# Patient Record
Sex: Male | Born: 1965 | Race: White | Hispanic: No | Marital: Married | Smoking: Current some day smoker
Health system: Southern US, Community
[De-identification: ages and names within clinical notes are randomized; demographics above are authoritative.]

## PROBLEM LIST (undated history)

## (undated) DIAGNOSIS — F419 Anxiety disorder, unspecified: Secondary | ICD-10-CM

## (undated) DIAGNOSIS — K219 Gastro-esophageal reflux disease without esophagitis: Secondary | ICD-10-CM

## (undated) HISTORY — PX: TONSILLECTOMY: SUR1361

## (undated) HISTORY — PX: PARATHYROIDECTOMY: SHX19

---

## 2006-08-14 ENCOUNTER — Encounter: Admission: RE | Admit: 2006-08-14 | Discharge: 2006-08-14 | Payer: Self-pay | Admitting: Surgery

## 2007-02-28 ENCOUNTER — Encounter (INDEPENDENT_AMBULATORY_CARE_PROVIDER_SITE_OTHER): Payer: Self-pay | Admitting: Surgery

## 2007-02-28 ENCOUNTER — Ambulatory Visit (HOSPITAL_COMMUNITY): Admission: RE | Admit: 2007-02-28 | Discharge: 2007-03-01 | Payer: Self-pay | Admitting: Surgery

## 2007-07-28 ENCOUNTER — Ambulatory Visit: Payer: Self-pay | Admitting: Gastroenterology

## 2007-07-28 LAB — CONVERTED CEMR LAB
Ceruloplasmin: 34 mg/dL (ref 21–63)
IgA: 75 mg/dL (ref 68–378)
Iron: 142 ug/dL (ref 42–165)
Prothrombin Time: 12.2 s (ref 10.9–13.3)
Saturation Ratios: 32.5 % (ref 20.0–50.0)
Tissue Transglutaminase Ab, IgA: 0 units (ref <7)

## 2007-08-17 ENCOUNTER — Encounter: Payer: Self-pay | Admitting: Gastroenterology

## 2007-08-17 ENCOUNTER — Ambulatory Visit (HOSPITAL_COMMUNITY): Admission: RE | Admit: 2007-08-17 | Discharge: 2007-08-17 | Payer: Self-pay | Admitting: Gastroenterology

## 2010-04-26 ENCOUNTER — Encounter: Payer: Self-pay | Admitting: Surgery

## 2010-08-18 NOTE — Op Note (Signed)
NAME:  [name] [name], [name] [name]                ACCOUNT NO.:  [id number]   MEDICAL RECORD NO.:  [id number]          PATIENT TYPE:  AMB   LOCATION:  DAY                          FACILITY:  [facility]   PHYSICIAN:  Velora Heckler, MD      DATE OF BIRTH:  05-25-1965   DATE OF PROCEDURE:  02/28/2007  DATE OF DISCHARGE:                               OPERATIVE REPORT   PREOPERATIVE DIAGNOSIS:  Primary hyperparathyroidism.   POSTOPERATIVE DIAGNOSIS:  Primary hyperparathyroidism.   PROCEDURES:  1. Neck exploration.  2. Right superior parathyroidectomy.   SURGEON:  Velora Heckler, M.D., FACS   ASSISTANT:  Leonie Man, M.D.   ANESTHESIA:  General, per Quillian Quince, M.D.   ESTIMATED BLOOD LOSS:  Minimal.   PREPARATION:  Betadine.   COMPLICATIONS:  None.   INDICATIONS:  The patient is a 45 year old white male from [place],  [place].  The patient had biochemical evidence of primary  hyperparathyroidism found on routine laboratory work.  Calcium level was  10.8.  Parathyroid hormone level 114.8.  Sestamibi scan failed to  localize parathyroid adenoma.  MRI scan of the neck suggested an  inferior right parathyroid gland measuring 6 mm, but this was not  definitive.  The patient and I discussed options for management, and he  decided to proceed with neck exploration.  The patient now comes to  surgery.   BODY OF REPORT:  Procedure was done in O.R. #11 at [facility].  The patient is brought to the operating room and placed in a  supine position on the operating room table.  Following administration  of general anesthesia, the patient is prepped and draped in the usual  strict aseptic fashion.  After ascertaining that an adequate level of  anesthesia had been obtained, a Kocher incision was made with a #15  blade.  Dissection was carried through subcutaneous tissues and  platysma.  Hemostasis was obtained with the electrocautery.  Skin flaps  were elevated cephalad  and caudad from the thyroid notch to the sternal  notch.  A Horner self-retaining retractor is placed for exposure.  Strap  muscles are incised in the midline.  Dissection is begun on the lower  right based on MRI scan evidence.  Recurrent nerve is identified and  preserved.  Tissue in the tracheoesophageal groove is dissected out.  Right thyroid lobe was mobilized, with venous tributaries divided  between medium Ligaclips with the electrocautery.  Exploration reveals  no abnormal-appearing parathyroid tissue.  Thyrothymic tract is excised.  It contains only adipose tissue.  Dissection is then moved to the lower  left.  Again, the tracheoesophageal groove is thoroughly explored.  Thyrothymic tract is excised and also contains only adipose tissue.  A  few lymph nodes are identified.  Further dissection on the left involves  complete mobilization of the left thyroid lobe, with division of the  middle thyroid vein between medium hemoclips.  Dissection is carried  posteriorly to the spine.  The lateral border of the hypopharynx and  esophagus is defined.  No abnormal parathyroid tissue is identified.  Retroesophageal space is opened and explored.   Next, we turned our attention back to the right.  Right thyroid lobe is  then fully mobilized.  Middle thyroid vein is divided between medium  Ligaclips.  Right thyroid lobe is fully mobilized.  Upon mobilization of  the upper pole, an abnormal-appearing nodule of tissue is identified.  This is gently dissected out.  It has associated adipose tissue, but  appears to be an abnormally enlarged parathyroid gland.  This is gently  dissected off the posterior aspect of the capsule of the thyroid.  Vascular tributaries are divided between small Ligaclips.  The entire  gland is excised.  Care os taken to avoid injury to the recurrent nerve,  which is just below the level of this gland.  The specimen measures 2.3  x 0.5 x 0.5 cm.  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  Good hemostasis is obtained throughout the neck.  Surgicel is  placed in the operative field bilaterally.  Strap muscles are  reapproximated in the midline with interrupted 3-0 Vicryl sutures.  Platysma is closed with interrupted 3-0 Vicryl sutures.  Skin is closed  over a running 4-0 Monocryl subcuticular suture.  Wound is washed and  dried, and Benzoin and Steri-Strips are applied.  Sterile dressings are  applied.  The patient is awakened from anesthesia and brought to the  recovery room in stable condition.  The patient tolerated the procedure  well.      Velora Heckler, MD  Electronically Signed     TMG/MEDQ  D:  02/28/2007  T:  02/28/2007  Job:  [id number]   cc:   Tammy R. Collins [place], M.D.  Fax: [phone]

## 2010-08-18 NOTE — Assessment & Plan Note (Signed)
[facility]                         GASTROENTEROLOGY OFFICE NOTE   NAME:[name], [name]                         MRN:          [id number]  DATE:07/28/2007                            DOB:          06-06-1965    REASON FOR REFERRAL:  Dr. Collins [place] asked me to evaluate Mr. [name] [name] in  consultation regarding abnormal liver tests.   HISTORY OF PRESENT ILLNESS:  Mr. [name] is a very pleasant 45 year old  man who has had elevated liver tests for at least 2 years.  He is now  sure whether he started taking Crestor before or after his elevated  liver tests were noticed but he has not come off the Crestor since then.  He has an ultrasound dated July 2007, done for abnormal liver tests.  This was done at [facility] and shows normal liver, normal  gallbladder, no stones, non-dilated bile duct.  Most recent liver tests  were done in February 2009, showing an AST of 94, ALT of 132, alk phos  of 151, total bilirubin was normal.  The rest of his complete metabolic  profile was essentially normal as well.  He has never had acute hepatitis that he knows of.  He has never been  jaundiced.  He has never used IV drugs recreationally.  No distant  tattoos.  He was never exposed to other people's blood.  He does have mild intermittent attacks of pain in his epigastrium  associated with some nausea and diarrhea as well.  He said this was  happening a lot with fatty foods and dairy foods.  He modified his diet  and has not had any problems like that in the past six months.   REVIEW OF SYSTEMS:  Notable for stable weight and is otherwise  essentially normal and is available on his nursing intake sheet.   PAST MEDICAL HISTORY:  1. GERD, pyrosis dating back to his teens.  Underwent upper endoscopy      in 2004 in [place] and was told it was normal.  Currently      symptoms are well controlled on Aciphex that he takes every other      day or so.  2. History of kidney  stones.  3. History of hyperparathyroidism, status post parathyroidectomy,      November 2008, by Dr. Gerrit Friends.   CURRENT MEDICINES:  Paxil, Aciphex, Allegra, and Crestor.   ALLERGIES:  No known drug allergies.   SOCIAL HISTORY:  Married with one 19-year-old daughter.  He is the  Surveyor, minerals for [employer].  He did a lot of travel overseas,  especially 2-3 years ago.  He drinks 1-2 caffeinated drinks a day,  probably 6-12 beers in a month.   FAMILY HISTORY:  No liver disease in family.  Mother had colon cancer  and polyps.  Breast cancer also runs in his family.   PHYSICAL EXAMINATION:  VITAL SIGNS:  Height 6 feet 2 inches, 223 pounds,  blood pressure 110/72, pulse 64.  CONSTITUTIONAL:  Generally well-appearing.  NEUROLOGIC:  Alert and oriented x3.  EYES:  Extraocular movements intact.  MOUTH:  Oropharynx  moist.  No lesions.  NECK:  Supple.  No lymphadenopathy.  CARDIOVASCULAR:  Heart regular rate and rhythm.  LUNGS:  Clear to auscultation bilaterally.  ABDOMEN:  Soft, nontender, nondistended.  Normal bowel sounds.  EXTREMITIES:  No lower extremity edema.  SKIN:  No rashes or lesions on visible extremities.   ASSESSMENT:  A 45 year old man with elevated liver tests.   He did have mild epigastric pains that would happen with some fatty  foods and some dairy products.  He has modified his diet and has had no  trouble for the past 6 months.  That and the fact that his ultrasound  was completely normal in 2007, leads me to believe his liver tests are  not from a biliary cause.  He may have fatty liver disease.  Statin  medicines are known to cause hepatic enzyme elevation.  He is not sure  whether he started the Crestor before or after his liver tests were  found to be elevated.  He did not have any obvious risk factors for  hepatitis B or C.   PLAN:  We will arrange for him to have the usual set of chronic liver  disease lab testing for autoimmune liver disease, PBC, PSC,  iron  overload, Wilson's disease, hepatitis A, B, and C studies.  If none of  these labs are very revealing and his liver tests remain elevated, I  will likely repeat the ultrasound of his liver as it has been a couple  of years since the ultrasound done at [facility] in 2007.  If that is also unrevealing, then I will have him simply hold his  Crestor, repeat his liver tests in 2-3 months following that.     Rachael Fee, MD  Electronically Signed    DPJ/MedQ  DD: 07/28/2007  DT: 07/28/2007  Job #: [id number]   cc:   Tammy R. Collins [place], M.D.

## 2011-01-12 LAB — DIFFERENTIAL
Eosinophils Relative: 4
Monocytes Relative: 7

## 2011-01-12 LAB — CBC
MCHC: 35.4
WBC: 7

## 2011-01-12 LAB — URINALYSIS, ROUTINE W REFLEX MICROSCOPIC
Bilirubin Urine: NEGATIVE
Glucose, UA: NEGATIVE
Hgb urine dipstick: NEGATIVE
Ketones, ur: NEGATIVE
Nitrite: NEGATIVE
Urobilinogen, UA: 0.2
pH: 6

## 2011-01-12 LAB — BASIC METABOLIC PANEL
BUN: 13
Chloride: 106
GFR calc Af Amer: >60
GFR calc non Af Amer: >60
Glucose, Bld: 120 — ABNORMAL HIGH
Potassium: 4.3

## 2011-01-12 LAB — CALCIUM
Calcium: 9.1
Calcium: 9.9

## 2011-01-12 LAB — PROTIME-INR: INR: 0.9

## 2015-03-26 ENCOUNTER — Encounter (HOSPITAL_COMMUNITY): Payer: Self-pay | Admitting: *Deleted

## 2015-03-26 ENCOUNTER — Emergency Department (HOSPITAL_COMMUNITY): Payer: BLUE CROSS/BLUE SHIELD

## 2015-03-26 ENCOUNTER — Emergency Department (HOSPITAL_COMMUNITY)
Admission: EM | Admit: 2015-03-26 | Discharge: 2015-03-27 | Disposition: A | Discharge status: Home / Self Care | Payer: BLUE CROSS/BLUE SHIELD | Attending: Emergency Medicine | Admitting: Emergency Medicine

## 2015-03-26 DIAGNOSIS — S29001A Unspecified injury of muscle and tendon of front wall of thorax, initial encounter: Secondary | ICD-10-CM | POA: Insufficient documentation

## 2015-03-26 DIAGNOSIS — Y9389 Activity, other specified: Secondary | ICD-10-CM | POA: Insufficient documentation

## 2015-03-26 DIAGNOSIS — W01198A Fall on same level from slipping, tripping and stumbling with subsequent striking against other object, initial encounter: Secondary | ICD-10-CM | POA: Diagnosis not present

## 2015-03-26 DIAGNOSIS — R0789 Other chest pain: Secondary | ICD-10-CM

## 2015-03-26 DIAGNOSIS — Z23 Encounter for immunization: Secondary | ICD-10-CM | POA: Insufficient documentation

## 2015-03-26 DIAGNOSIS — Y998 Other external cause status: Secondary | ICD-10-CM | POA: Diagnosis not present

## 2015-03-26 DIAGNOSIS — F17200 Nicotine dependence, unspecified, uncomplicated: Secondary | ICD-10-CM | POA: Insufficient documentation

## 2015-03-26 DIAGNOSIS — Z8659 Personal history of other mental and behavioral disorders: Secondary | ICD-10-CM | POA: Insufficient documentation

## 2015-03-26 DIAGNOSIS — S0012XA Contusion of left eyelid and periocular area, initial encounter: Secondary | ICD-10-CM

## 2015-03-26 DIAGNOSIS — Y9289 Other specified places as the place of occurrence of the external cause: Secondary | ICD-10-CM | POA: Diagnosis not present

## 2015-03-26 DIAGNOSIS — S01112A Laceration without foreign body of left eyelid and periocular area, initial encounter: Secondary | ICD-10-CM | POA: Insufficient documentation

## 2015-03-26 DIAGNOSIS — Z8719 Personal history of other diseases of the digestive system: Secondary | ICD-10-CM | POA: Diagnosis not present

## 2015-03-26 DIAGNOSIS — W19XXXA Unspecified fall, initial encounter: Secondary | ICD-10-CM

## 2015-03-26 DIAGNOSIS — S0181XA Laceration without foreign body of other part of head, initial encounter: Secondary | ICD-10-CM

## 2015-03-26 DIAGNOSIS — S0993XA Unspecified injury of face, initial encounter: Secondary | ICD-10-CM | POA: Diagnosis present

## 2015-03-26 HISTORY — DX: Gastro-esophageal reflux disease without esophagitis: K21.9

## 2015-03-26 HISTORY — DX: Anxiety disorder, unspecified: F41.9

## 2015-03-26 LAB — I-STAT TROPONIN, ED: TROPONIN I, POC: 0 ng/mL (ref 0.00–0.08)

## 2015-03-26 LAB — CBC
HEMATOCRIT: 38.9 % — AB (ref 39.0–52.0)
Hemoglobin: 14 g/dL (ref 13.0–17.0)
MCH: 32.3 pg (ref 26.0–34.0)
MCHC: 36 g/dL (ref 30.0–36.0)
MCV: 89.8 fL (ref 78.0–100.0)
PLATELETS: 218 10*3/uL (ref 150–400)
RBC: 4.33 MIL/uL (ref 4.22–5.81)
RDW: 12.3 % (ref 11.5–15.5)
WBC: 8.5 10*3/uL (ref 4.0–10.5)

## 2015-03-26 IMAGING — CT CT HEAD W/O CM
3 of 6 series · 14 of 47 positions shown, 16 images · non-contrast
Comparison: None.

CLINICAL DATA: 49-year-old male with fall and head trauma.

EXAM:
CT HEAD WITHOUT CONTRAST
CT CERVICAL SPINE WITHOUT CONTRAST
TECHNIQUE: Multidetector CT imaging of the head and cervical spine was
performed following the standard protocol without intravenous
contrast. Multiplanar CT image reconstructions of the cervical spine
were also generated.

[Series 9: sagittals · sagittal · 0.26mm/px · 3 of 53 slices shown]
[im 18/53  brain]
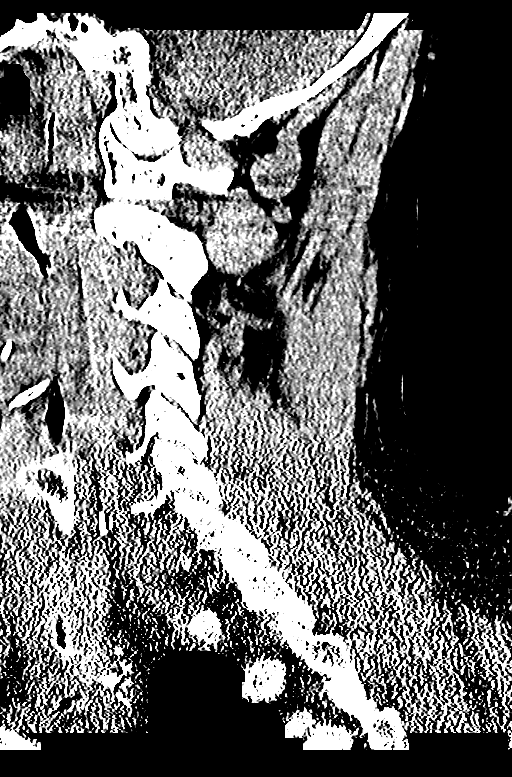
[im 27/53  brain]
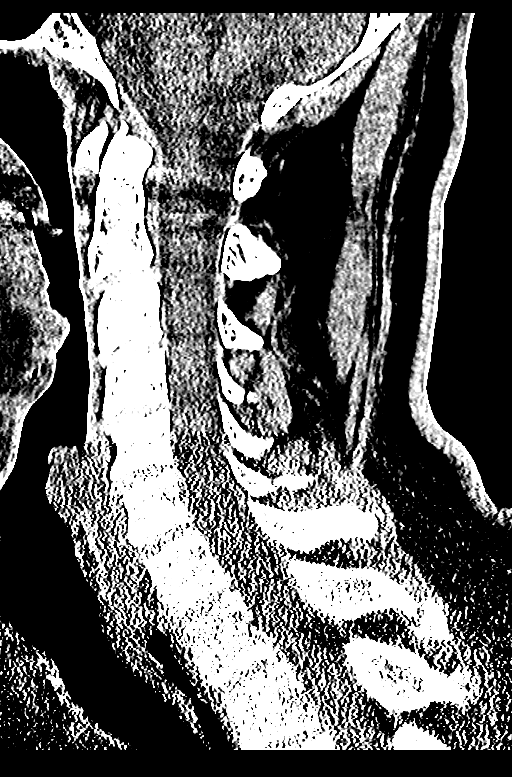
[im 35/53  brain]
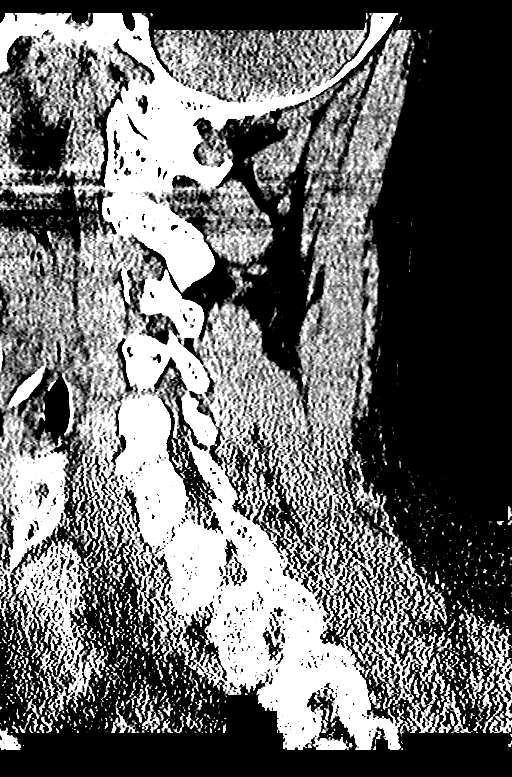

[Series 10: orthogonals · axial · 0.21mm/px · z∈[-313,-165]mm · 8 of 116 slices shown, 10 images]
[im 9/116  brain]
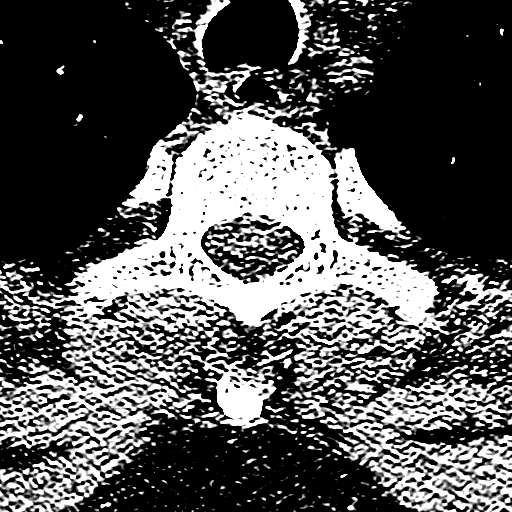
[im 9/116  bone]
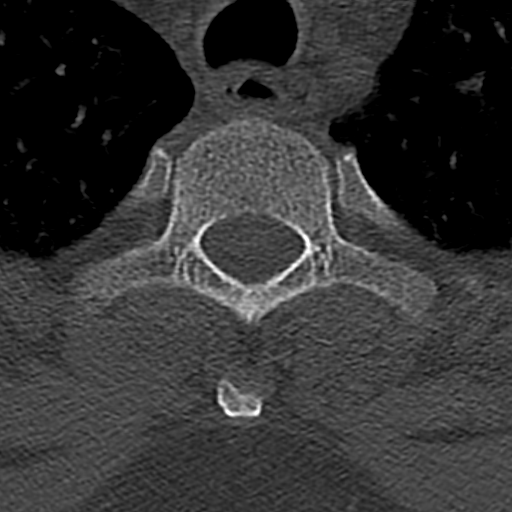
[im 27/116  brain]
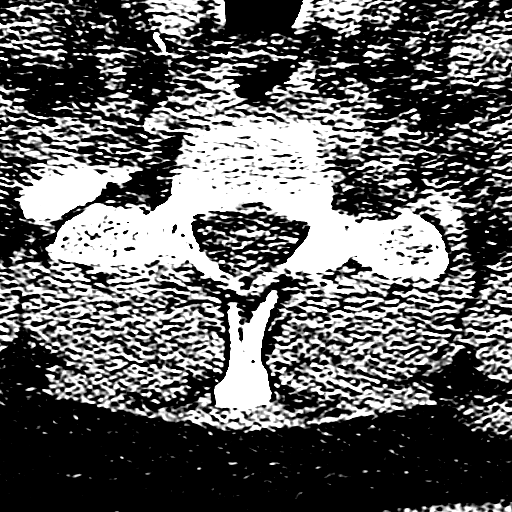
[im 36/116  brain]
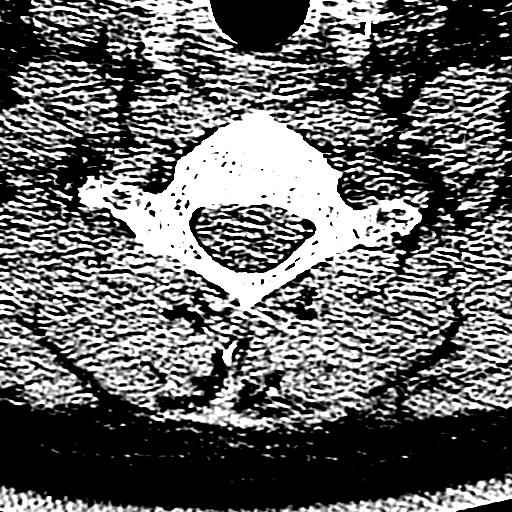
[im 54/116  brain]
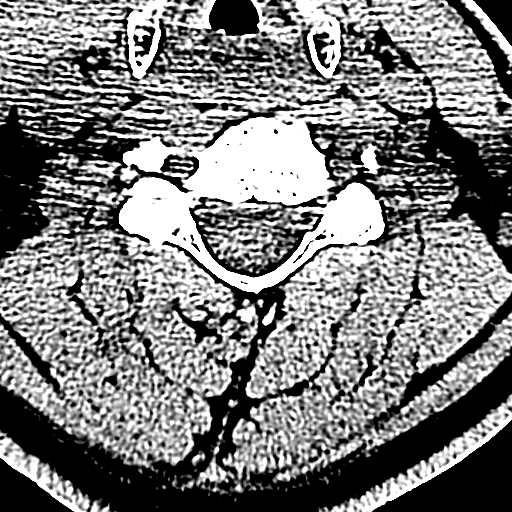
[im 62/116  brain]
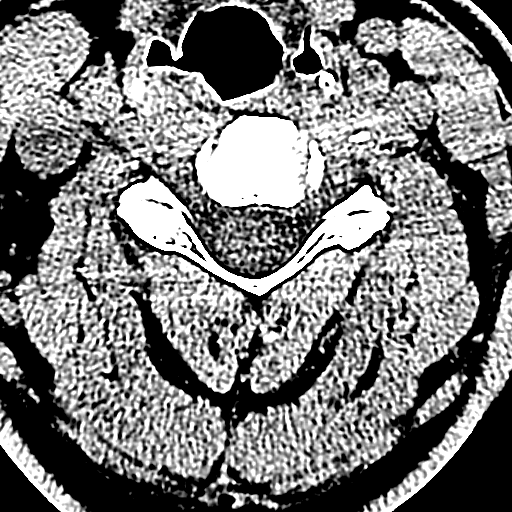
[im 62/116  bone]
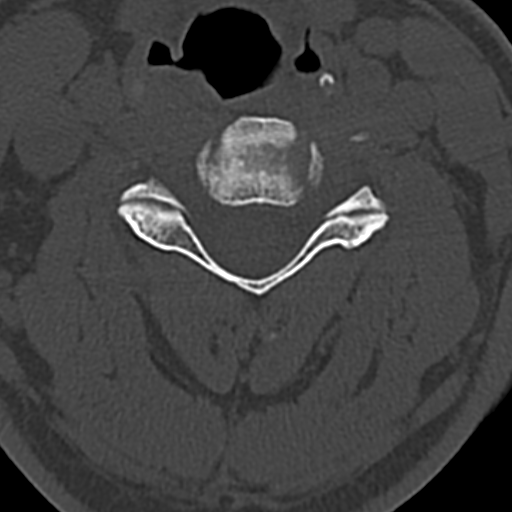
[im 80/116  brain]
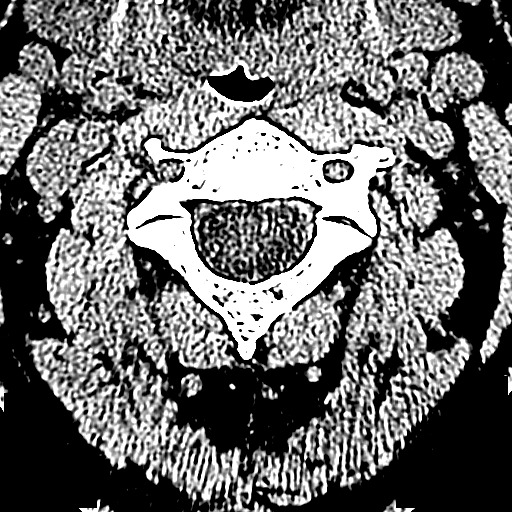
[im 89/116  brain]
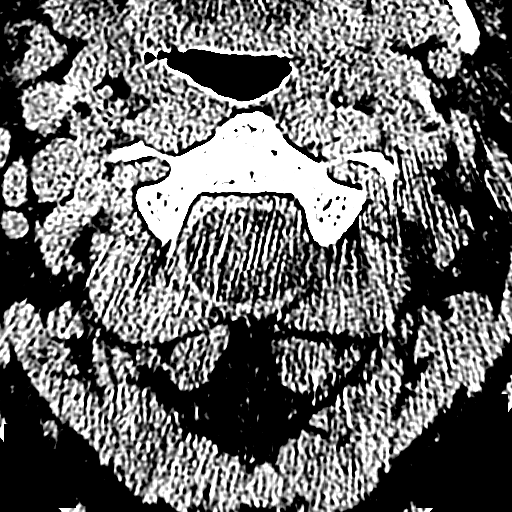
[im 107/116  brain]
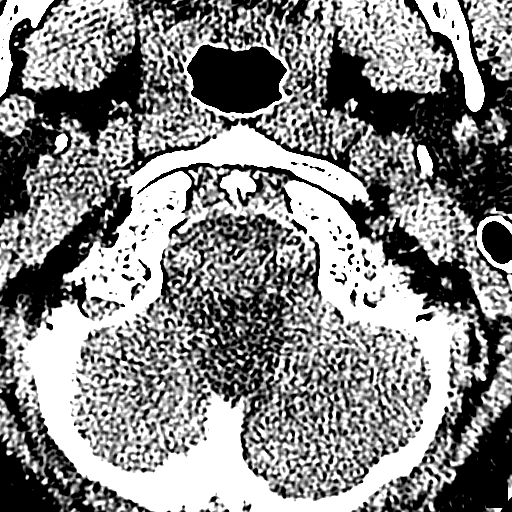

[Series 11: coronals · coronal · 0.23mm/px · 3 of 44 slices shown]
[im 18/44  brain]
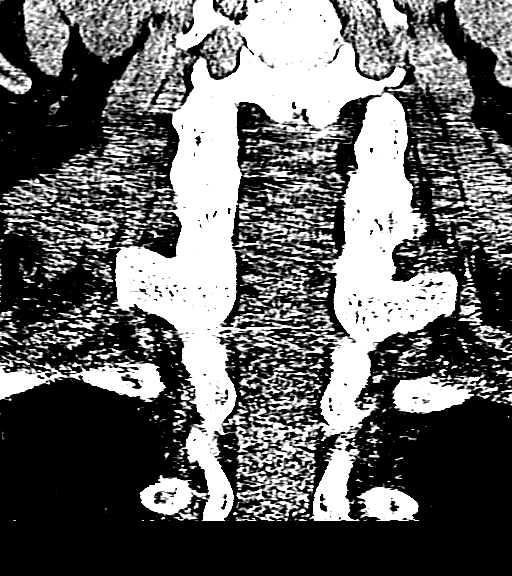
[im 26/44  brain]
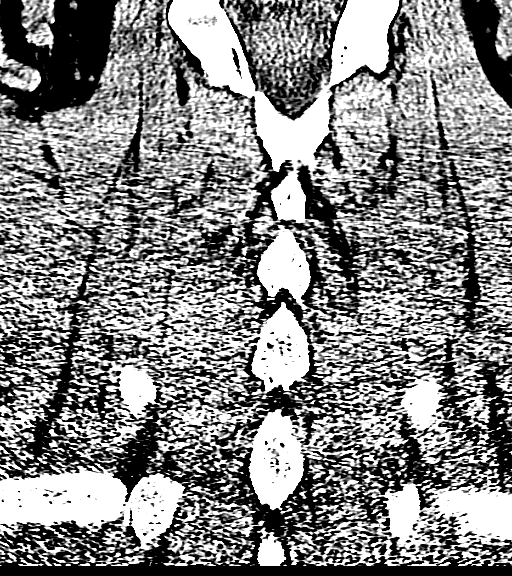
[im 35/44  brain]
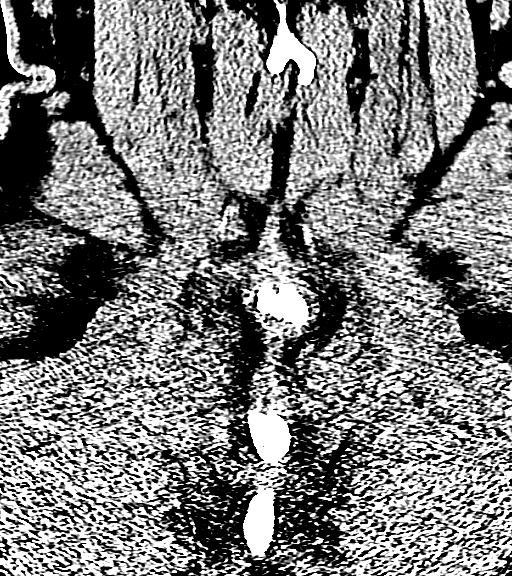

[14 of 47 positions shown; findings below may reference images not displayed]

FINDINGS: CT HEAD FINDINGS

The ventricles and the sulci are appropriate in size for the
patient's age. There is no intracranial hemorrhage. No midline shift
or mass effect identified. The gray-white matter differentiation is
preserved.

The visualized paranasal sinuses and mastoid air cells are well
aerated. The calvarium is intact.

CT CERVICAL SPINE FINDINGS

There is no acute fracture or subluxation of the cervical spine.The
intervertebral disc spaces are preserved.The odontoid and spinous
processes are intact.There is normal anatomic alignment of the C1-C2
lateral masses. The visualized soft tissues appear unremarkable.

Multiple surgical clips noted adjacent the thyroid gland and at the
base of the neck.
IMPRESSION: No acute intracranial pathology.

No acute/ traumatic cervical spine pathology.

## 2015-03-26 MED ORDER — TETANUS-DIPHTH-ACELL PERTUSSIS 5-2.5-18.5 LF-MCG/0.5 IM SUSP
0.5000 mL | Freq: Once | INTRAMUSCULAR | Status: AC
  Administered 2015-03-26: 0.5 mL via INTRAMUSCULAR
  Filled 2015-03-26: qty 0.5

## 2015-03-26 NOTE — ED Notes (Signed)
Returned from CT and xray.

## 2015-03-26 NOTE — ED Notes (Signed)
Patient presents after tripping over the steps hitting the left side of his face at the corner of his eye and c/o pain to the right upper chest

## 2015-03-26 NOTE — ED Provider Notes (Addendum)
CSN: [id number]     Arrival date & time 03/26/15  2313 History   By signing my name below, I, [name] [name], attest that this documentation has been prepared under the direction and in the presence of [name] Guiseana Duo [place] Hilligoss, MD.  Electronically Signed: Arlan OrganAshley [name], ED Scribe. 03/26/2015. 11:55 PM.   Chief Complaint  Patient presents with  . Fall  . Chest Pain   The history is provided by the patient. No language interpreter was used.    HPI Comments: [name] [name] is a [age] y.o. male who presents to the Emergency Department here after a fall sustained a few hours prior to arrival. Pt states he tripped and fell to the ground. However, he states he caught himself on the way down. Does not remember how he fell or if having LOC. He now c/o constant, ongoing pain to the R upper chest and mild pain to the R hand. Wife states chest discomfort "brought him to his knees at home". Pain worse with movement and palpation of the right anterior chest wall.  Pt also noted an abrasion surrounding the L eye. No aggravating or alleviating factors at this time. No OTC medications or home remedies attempted prior to arrival. No recent fever, chills, nausea, vomiting, abdominal pain, neck pain, or back pain. No weakness, loss of sensation, or numbness to extremities. Mr. [name] [name] admits to consuming 6 beers this evening with last beer 2 hours ago. Tetanus status unknown.  PCP: No primary care provider on file.    Past Medical History  Diagnosis Date  . GERD (gastroesophageal reflux disease)   . Anxiety    Past Surgical History  Procedure Laterality Date  . Tonsillectomy    . Parathyroidectomy     History reviewed. No pertinent family history. Social History  Substance Use Topics  . Smoking status: Current Some Day Smoker  . Smokeless tobacco: Never Used  . Alcohol Use: Yes    Review of Systems  Constitutional: Negative for fever and chills.  Respiratory: Negative for shortness of breath.   Cardiovascular:  Positive for chest pain.  Musculoskeletal: Positive for arthralgias.  Skin: Positive for wound.  Neurological: Negative for weakness and numbness.  Psychiatric/Behavioral: Negative for confusion.  All other systems reviewed and are negative.     Allergies  Review of patient's allergies indicates no known allergies.  Home Medications   Prior to Admission medications   Not on File   Triage Vitals: BP 109/80 mmHg  Pulse 86  Temp(Src) 97.9 F (36.6 C) (Oral)  Resp 18  Ht 6\' 1"  (1.854 m)  Wt 210 lb (95.255 kg)  BMI 27.71 kg/m2  SpO2 97%   Physical Exam  Constitutional: He is oriented to person, place, and time. He appears well-developed and well-nourished.  HENT:  Head: Normocephalic.  0.5 cm laceration just lateral L eyebrow Periorbital edema and ecchymosis to the R eye   Eyes: EOM are normal. Pupils are equal, round, and reactive to light.  Neck: Normal range of motion. Neck supple.  No cervical spine tenderness  Cardiovascular: Normal rate, regular rhythm, normal heart sounds and intact distal pulses.   Pulmonary/Chest: Effort normal and breath sounds normal. No respiratory distress. He exhibits no tenderness.  L upper anterior wall chest wall tenderness No deformity or crepitus  Abdominal: Soft. Bowel sounds are normal. He exhibits no distension. There is no tenderness.  Musculoskeletal: Normal range of motion. He exhibits no tenderness.  Neurological: He is alert and oriented to person, place, and time.  Full strength in all 4 extremities against gravity Sensation intact over face and all 4 extremities  Fluent speech   Skin: Skin is warm and dry.  Psychiatric: He has a normal mood and affect. Judgment normal.  Nursing note and vitals reviewed.  ED Course  .Marland KitchenLaceration Repair Date/Time: 03/27/2015 1:06 AM Performed by: Crista Curb DUO Authorized by: Crista Curb DUO Consent: Verbal consent obtained. Risks and benefits: risks, benefits and alternatives were  discussed Consent given by: patient Body area: head/neck Location details: left eyebrow Laceration length: 1 cm Foreign bodies: no foreign bodies Tendon involvement: none Nerve involvement: none Vascular damage: no Patient sedated: no Preparation: Patient was prepped and draped in the usual sterile fashion. Irrigation solution: saline Irrigation method: syringe Amount of cleaning: standard Debridement: none Degree of undermining: none Skin closure: 6-0 nylon Number of sutures: 1 Technique: simple Approximation: close Approximation difficulty: simple Patient tolerance: Patient tolerated the procedure well with no immediate complications   (including critical care time)  DIAGNOSTIC STUDIES: Oxygen Saturation is 97% on RA, adequate by my interpretation.    COORDINATION OF CARE: 11:30 PM- Will order CXR, BMP, CBC, i-stat troponin, and EKG. Discussed treatment plan with pt at bedside and pt agreed to plan.     Labs Review Labs Reviewed  BASIC METABOLIC PANEL - Abnormal; Notable for the following:    Chloride 99 (*)    Glucose, Bld 107 (*)    All other components within normal limits  CBC - Abnormal; Notable for the following:    HCT 38.9 (*)    All other components within normal limits  Rosezena Sensor, ED    Imaging Review Dg Chest 2 View  03/27/2015  CLINICAL DATA:  Fall with right upper chest pain. Initial encounter. EXAM: CHEST  2 VIEW COMPARISON:  02/27/2007 FINDINGS: Normal heart size and mediastinal contours. No acute infiltrate or edema. No effusion or pneumothorax. No acute osseous findings. Thoracic inlet surgical clips correlating with parathyroidectomy history. IMPRESSION: Negative. Electronically Signed   By: Marnee Spring M.D.   On: 03/27/2015 00:20   Ct Head Wo Contrast  03/27/2015  CLINICAL DATA:  49 year old male with fall and head trauma. EXAM: CT HEAD WITHOUT CONTRAST CT CERVICAL SPINE WITHOUT CONTRAST TECHNIQUE: Multidetector CT imaging of the head  and cervical spine was performed following the standard protocol without intravenous contrast. Multiplanar CT image reconstructions of the cervical spine were also generated. COMPARISON:  None. FINDINGS: CT HEAD FINDINGS The ventricles and the sulci are appropriate in size for the patient's age. There is no intracranial hemorrhage. No midline shift or mass effect identified. The gray-white matter differentiation is preserved. The visualized paranasal sinuses and mastoid air cells are well aerated. The calvarium is intact. CT CERVICAL SPINE FINDINGS There is no acute fracture or subluxation of the cervical spine.The intervertebral disc spaces are preserved.The odontoid and spinous processes are intact.There is normal anatomic alignment of the C1-C2 lateral masses. The visualized soft tissues appear unremarkable. Multiple surgical clips noted adjacent the thyroid gland and at the base of the neck. IMPRESSION: No acute intracranial pathology. No acute/ traumatic cervical spine pathology. Electronically Signed   By: Elgie Collard M.D.   On: 03/27/2015 00:29   Ct Cervical Spine Wo Contrast  03/27/2015  CLINICAL DATA:  49 year old male with fall and head trauma. EXAM: CT HEAD WITHOUT CONTRAST CT CERVICAL SPINE WITHOUT CONTRAST TECHNIQUE: Multidetector CT imaging of the head and cervical spine was performed following the standard protocol without intravenous contrast. Multiplanar CT image reconstructions  of the cervical spine were also generated. COMPARISON:  None. FINDINGS: CT HEAD FINDINGS The ventricles and the sulci are appropriate in size for the patient's age. There is no intracranial hemorrhage. No midline shift or mass effect identified. The gray-white matter differentiation is preserved. The visualized paranasal sinuses and mastoid air cells are well aerated. The calvarium is intact. CT CERVICAL SPINE FINDINGS There is no acute fracture or subluxation of the cervical spine.The intervertebral disc spaces are  preserved.The odontoid and spinous processes are intact.There is normal anatomic alignment of the C1-C2 lateral masses. The visualized soft tissues appear unremarkable. Multiple surgical clips noted adjacent the thyroid gland and at the base of the neck. IMPRESSION: No acute intracranial pathology. No acute/ traumatic cervical spine pathology. Electronically Signed   By: Elgie Collard M.D.   On: 03/27/2015 00:29   I have personally reviewed and evaluated these images and lab results as part of my medical decision-making.   EKG Interpretation   Date/Time:  Wednesday March 26 2015 23:17:39 EST Ventricular Rate:  81 PR Interval:  188 QRS Duration: 94 QT Interval:  354 QTC Calculation: 411 R Axis:   88 Text Interpretation:  Normal sinus rhythm Normal ECG No change from prior  EKG Confirmed by Eura Radabaugh MD, Annabelle Harman ([id number]) on 03/26/2015 11:28:36 PM      MDM   Final diagnoses:  Fall, initial encounter  Laceration of face, initial encounter  Chest wall pain  Periorbital ecchymosis of left eye, initial encounter    50 year old male, otherwise healthy and not anticoagulated who presents after mechanical fall. Is somewhat intoxicated on presentation, but answering questions appropriately and neurologically intact. Vital signs are non-concerning. He has a small laceration over the left eyebrow, with periorbital ecchymosis. He also has right upper anterior chest wall tenderness to palpation and with movement. No other major injuries noted on exam. Chest x-rays without acute cardiopulmonary processes, and presentation consistent with likely chest wall muscle strain or musculoskeletal pain. Due to his intoxication, CT head and cervical spine was performed. This is negative for any acute injuries. Eyes laceration is repaired according to the procedure note above. Reviewed strict return and follow-up instructions as well as laceration care. He and his wife expressed understanding of all discharge  instructions and felt comfortable to plan of care.  I personally performed the services described in this documentation, which was scribed in my presence. The recorded information has been reviewed and is accurate.   [name] Guise, MD 03/27/15 [id number]  [name] Guise, MD 03/27/15 [phone]

## 2015-03-26 NOTE — ED Notes (Addendum)
Wound and swelling above left eye, corner of eye, bleeding controlled.

## 2015-03-26 NOTE — ED Notes (Signed)
Patient taken to xray and CT.

## 2015-03-27 LAB — BASIC METABOLIC PANEL
ANION GAP: 13 (ref 5–15)
BUN: 9 mg/dL (ref 6–20)
CHLORIDE: 99 mmol/L — AB (ref 101–111)
CO2: 25 mmol/L (ref 22–32)
Calcium: 9 mg/dL (ref 8.9–10.3)
Creatinine, Ser: 0.91 mg/dL (ref 0.61–1.24)
Glucose, Bld: 107 mg/dL — ABNORMAL HIGH (ref 65–99)
POTASSIUM: 3.7 mmol/L (ref 3.5–5.1)
SODIUM: 137 mmol/L (ref 135–145)

## 2015-03-27 MED ORDER — LIDOCAINE HCL (PF) 1 % IJ SOLN
30.0000 mL | Freq: Once | INTRAMUSCULAR | Status: DC
  Filled 2015-03-27: qty 30

## 2015-03-27 NOTE — Discharge Instructions (Signed)
Take motrin and tylenol as needed for pain control. Please look out for signs of infection and return to the ED if you're concerned.  your imaging studies today does not show severe injuries. Please have your sutures removed in around 5 days.  Chest Wall Pain Chest wall pain is pain in or around the bones and muscles of your chest. Sometimes, an injury causes this pain. Sometimes, the cause may not be known. This pain may take several weeks or longer to get better. HOME CARE INSTRUCTIONS  Pay attention to any changes in your symptoms. Take these actions to help with your pain:   Rest as told by your health care provider.   Avoid activities that cause pain. These include any activities that use your chest muscles or your abdominal and side muscles to lift heavy items.   If directed, apply ice to the painful area:  Put ice in a plastic bag.  Place a towel between your skin and the bag.  Leave the ice on for 20 minutes, 2-3 times per day.  Take over-the-counter and prescription medicines only as told by your health care provider.  Do not use tobacco products, including cigarettes, chewing tobacco, and e-cigarettes. If you need help quitting, ask your health care provider.  Keep all follow-up visits as told by your health care provider. This is important. SEEK MEDICAL CARE IF:  You have a fever.  Your chest pain becomes worse.  You have new symptoms. SEEK IMMEDIATE MEDICAL CARE IF:  You have nausea or vomiting.  You feel sweaty or light-headed.  You have a cough with phlegm (sputum) or you cough up blood.  You develop shortness of breath.   This information is not intended to replace advice given to you by your health care provider. Make sure you discuss any questions you have with your health care provider.   Document Released: 03/22/2005 Document Revised: 12/11/2014 Document Reviewed: 06/17/2014 Elsevier Interactive Patient Education 2016 [employer]  Facial  Laceration A facial laceration is a cut on the face. These injuries can be painful and cause bleeding. Some cuts may need to be closed with stitches (sutures), skin adhesive strips, or wound glue. Cuts usually heal quickly but can leave a scar. It can take 1-2 years for the scar to go away completely. HOME CARE   Only take medicines as told by your doctor.  Follow your doctor's instructions for wound care. For Stitches:  Keep the cut clean and dry.  If you have a bandage (dressing), change it at least once a day. Change the bandage if it gets wet or dirty, or as told by your doctor.  Wash the cut with soap and water 2 times a day. Rinse the cut with water. Pat it dry with a clean towel.  Put a thin layer of medicated cream on the cut as told by your doctor.  You may shower after the first 24 hours. Do not soak the cut in water until the stitches are removed.  Have your stitches removed as told by your doctor.  Do not wear any makeup until a few days after your stitches are removed. For Skin Adhesive Strips:  Keep the cut clean and dry.  Do not get the strips wet. You may take a bath, but be careful to keep the cut dry.  If the cut gets wet, pat it dry with a clean towel.  The strips will fall off on their own. Do not remove the strips that are still  stuck to the cut. For Wound Glue:  You may shower or take baths. Do not soak or scrub the cut. Do not swim. Avoid heavy sweating until the glue falls off on its own. After a shower or bath, pat the cut dry with a clean towel.  Do not put medicine or makeup on your cut until the glue falls off.  If you have a bandage, do not put tape over the glue.  Avoid lots of sunlight or tanning lamps until the glue falls off.  The glue will fall off on its own in 5-10 days. Do not pick at the glue. After Healing:  Put sunscreen on the cut for the first year to reduce your scar. GET HELP IF:  You have a fever. GET HELP RIGHT AWAY IF:    Your cut area gets red, painful, or puffy (swollen).  You see a yellowish-white fluid (pus) coming from the cut.   This information is not intended to replace advice given to you by your health care provider. Make sure you discuss any questions you have with your health care provider.   Document Released: 09/08/2007 Document Revised: 04/12/2014 Document Reviewed: 11/02/2012 Elsevier Interactive Patient Education 2016 [employer]  Laceration Care, Adult A laceration is a cut that goes through all of the layers of the skin and into the tissue that is right under the skin. Some lacerations heal on their own. Others need to be closed with stitches (sutures), staples, skin adhesive strips, or skin glue. Proper laceration care minimizes the risk of infection and helps the laceration to heal better. HOW TO CARE FOR YOUR LACERATION If sutures or staples were used:  Keep the wound clean and dry.  If you were given a bandage (dressing), you should change it at least one time per day or as told by your health care provider. You should also change it if it becomes wet or dirty.  Keep the wound completely dry for the first 24 hours or as told by your health care provider. After that time, you may shower or bathe. However, make sure that the wound is not soaked in water until after the sutures or staples have been removed.  Clean the wound one time each day or as told by your health care provider:  Wash the wound with soap and water.  Rinse the wound with water to remove all soap.  Pat the wound dry with a clean towel. Do not rub the wound.  After cleaning the wound, apply a thin layer of antibiotic ointmentas told by your health care provider. This will help to prevent infection and keep the dressing from sticking to the wound.  Have the sutures or staples removed as told by your health care provider. If skin adhesive strips were used:  Keep the wound clean and dry.  If you were given a  bandage (dressing), you should change it at least one time per day or as told by your health care provider. You should also change it if it becomes dirty or wet.  Do not get the skin adhesive strips wet. You may shower or bathe, but be careful to keep the wound dry.  If the wound gets wet, pat it dry with a clean towel. Do not rub the wound.  Skin adhesive strips fall off on their own. You may trim the strips as the wound heals. Do not remove skin adhesive strips that are still stuck to the wound. They will fall off in time. If  skin glue was used:  Try to keep the wound dry, but you may briefly wet it in the shower or bath. Do not soak the wound in water, such as by swimming.  After you have showered or bathed, gently pat the wound dry with a clean towel. Do not rub the wound.  Do not do any activities that will make you sweat heavily until the skin glue has fallen off on its own.  Do not apply liquid, cream, or ointment medicine to the wound while the skin glue is in place. Using those may loosen the film before the wound has healed.  If you were given a bandage (dressing), you should change it at least one time per day or as told by your health care provider. You should also change it if it becomes dirty or wet.  If a dressing is placed over the wound, be careful not to apply tape directly over the skin glue. Doing that may cause the glue to be pulled off before the wound has healed.  Do not pick at the glue. The skin glue usually remains in place for 5-10 days, then it falls off of the skin. General Instructions  Take over-the-counter and prescription medicines only as told by your health care provider.  If you were prescribed an antibiotic medicine or ointment, take or apply it as told by your doctor. Do not stop using it even if your condition improves.  To help prevent scarring, make sure to cover your wound with sunscreen whenever you are outside after stitches are removed, after  adhesive strips are removed, or when glue remains in place and the wound is healed. Make sure to wear a sunscreen of at least 30 SPF.  Do not scratch or pick at the wound.  Keep all follow-up visits as told by your health care provider. This is important.  Check your wound every day for signs of infection. Watch for:  Redness, swelling, or pain.  Fluid, blood, or pus.  Raise (elevate) the injured area above the level of your heart while you are sitting or lying down, if possible. SEEK MEDICAL CARE IF:  You received a tetanus shot and you have swelling, severe pain, redness, or bleeding at the injection site.  You have a fever.  A wound that was closed breaks open.  You notice a bad smell coming from your wound or your dressing.  You notice something coming out of the wound, such as wood or glass.  Your pain is not controlled with medicine.  You have increased redness, swelling, or pain at the site of your wound.  You have fluid, blood, or pus coming from your wound.  You notice a change in the color of your skin near your wound.  You need to change the dressing frequently due to fluid, blood, or pus draining from the wound.  You develop a new rash.  You develop numbness around the wound. SEEK IMMEDIATE MEDICAL CARE IF:  You develop severe swelling around the wound.  Your pain suddenly increases and is severe.  You develop painful lumps near the wound or on skin that is anywhere on your body.  You have a red streak going away from your wound.  The wound is on your hand or foot and you cannot properly move a finger or toe.  The wound is on your hand or foot and you notice that your fingers or toes look pale or bluish.   This information is not intended to  replace advice given to you by your health care provider. Make sure you discuss any questions you have with your health care provider.   Document Released: 03/22/2005 Document Revised: 08/06/2014 Document  Reviewed: 03/18/2014 Elsevier Interactive Patient Education [employer]

## 2015-03-27 NOTE — ED Notes (Signed)
Dr. Liu at bedside for laceration repair.
